# Patient Record
Sex: Female | Born: 1995 | Race: Black or African American | Hispanic: No | Marital: Single | Smoking: Never smoker
Health system: Southern US, Community
[De-identification: ages and names within clinical notes are randomized; demographics above are authoritative.]

---

## 2015-01-29 ENCOUNTER — Emergency Department (HOSPITAL_COMMUNITY): Payer: Self-pay

## 2015-01-29 ENCOUNTER — Emergency Department (HOSPITAL_COMMUNITY)
Admission: EM | Admit: 2015-01-29 | Discharge: 2015-01-29 | Disposition: A | Discharge status: Home / Self Care | Payer: Self-pay | Attending: Emergency Medicine | Admitting: Emergency Medicine

## 2015-01-29 ENCOUNTER — Encounter (HOSPITAL_COMMUNITY): Payer: Self-pay

## 2015-01-29 DIAGNOSIS — S199XXA Unspecified injury of neck, initial encounter: Secondary | ICD-10-CM | POA: Insufficient documentation

## 2015-01-29 DIAGNOSIS — Y92410 Unspecified street and highway as the place of occurrence of the external cause: Secondary | ICD-10-CM | POA: Insufficient documentation

## 2015-01-29 DIAGNOSIS — Y998 Other external cause status: Secondary | ICD-10-CM | POA: Insufficient documentation

## 2015-01-29 DIAGNOSIS — S2231XA Fracture of one rib, right side, initial encounter for closed fracture: Secondary | ICD-10-CM

## 2015-01-29 DIAGNOSIS — Y9389 Activity, other specified: Secondary | ICD-10-CM | POA: Insufficient documentation

## 2015-01-29 DIAGNOSIS — S4992XA Unspecified injury of left shoulder and upper arm, initial encounter: Secondary | ICD-10-CM | POA: Insufficient documentation

## 2015-01-29 DIAGNOSIS — S2241XA Multiple fractures of ribs, right side, initial encounter for closed fracture: Secondary | ICD-10-CM | POA: Insufficient documentation

## 2015-01-29 MED ORDER — OXYCODONE-ACETAMINOPHEN 5-325 MG PO TABS
1.0000 | ORAL_TABLET | Freq: Once | ORAL | Status: AC
  Administered 2015-01-29: 1 via ORAL
  Filled 2015-01-29: qty 1

## 2015-01-29 MED ORDER — TETANUS-DIPHTH-ACELL PERTUSSIS 5-2.5-18.5 LF-MCG/0.5 IM SUSP: 0.5000 mL | Freq: Once | INTRAMUSCULAR | Status: DC

## 2015-01-29 MED ORDER — MORPHINE SULFATE 4 MG/ML IJ SOLN: 8.0000 mg | Freq: Once | INTRAMUSCULAR | Status: DC

## 2015-01-29 MED ORDER — OXYCODONE-ACETAMINOPHEN 5-325 MG PO TABS: 1.0000 | ORAL_TABLET | Freq: Once | ORAL | Status: AC

## 2015-01-29 MED ORDER — IBUPROFEN 600 MG PO TABS: 600.0000 mg | ORAL_TABLET | Freq: Four times a day (QID) | ORAL | Status: AC | PRN

## 2015-01-29 NOTE — ED Notes (Signed)
Spoke with pt. About giving her an injection for pain.  Pt. Stated, "I do not want a shot."  Verified this with pt. She stated, "No , I don't want a shot for pain,"   Will report to MD.
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
## 2015-01-29 NOTE — Discharge Instructions (Signed)

## 2015-01-29 NOTE — ED Notes (Signed)
Moved to Room 17. Called pt.s mother  Per pt.s request [phone]. Pt.s mother Marissa NestleDrain did not answer, left message for pt.'s mother to call.

## 2015-01-29 NOTE — ED Notes (Signed)
Patient educated on incentive spirometer at discharge.

## 2015-01-29 NOTE — ED Notes (Addendum)
Dr. Delford FieldWright advised patient can have oxycodone 5mg  instead of morphine since patient advised she did not want to get an injection. Dr. Delford FieldWright also advised that if patient can validate if last tetanus was within a year she does not have to have one here.

## 2015-01-29 NOTE — ED Notes (Signed)
Patient denies pain and is resting comfortably.  
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
